# Patient Record
Sex: Male | Born: 1995 | Race: Black or African American | Hispanic: No | Marital: Single | Smoking: Never smoker
Health system: Southern US, Community
[De-identification: ages and names within clinical notes are randomized; demographics above are authoritative.]

## PROBLEM LIST (undated history)

## (undated) HISTORY — PX: ELBOW SURGERY: SHX618

## (undated) HISTORY — PX: OTHER SURGICAL HISTORY: SHX169

---
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
## 2021-07-04 ENCOUNTER — Encounter (HOSPITAL_COMMUNITY): Payer: Self-pay

## 2021-07-04 ENCOUNTER — Ambulatory Visit (HOSPITAL_COMMUNITY)
Admission: EM | Admit: 2021-07-04 | Discharge: 2021-07-04 | Disposition: A | Discharge status: Home / Self Care | Payer: BC Managed Care – PPO | Attending: Nurse Practitioner | Admitting: Nurse Practitioner

## 2021-07-04 ENCOUNTER — Other Ambulatory Visit: Payer: Self-pay

## 2021-07-04 DIAGNOSIS — M546 Pain in thoracic spine: Secondary | ICD-10-CM

## 2021-07-04 DIAGNOSIS — M545 Low back pain, unspecified: Secondary | ICD-10-CM | POA: Diagnosis not present

## 2021-07-04 MED ORDER — CYCLOBENZAPRINE HCL 5 MG PO TABS: 5.0000 mg | ORAL_TABLET | Freq: Three times a day (TID) | ORAL | 0 refills | Status: AC | PRN

## 2021-07-04 NOTE — ED Provider Notes (Signed)
?[facility] ? ? ? ?CSN: [id number] ?Arrival date & time: 07/04/21  1730 ? ? ?  ? ?History   ?Chief Complaint ?Chief Complaint  ?Patient presents with  ? Back Pain  ? ? ?HPI ?Jaime Burton is a 26 y.o. male.  ? ?Patient reports 1 week history of acute back pain.  Reports last week while travelling to [place] for a work Astronomer, he carried heavy luggage on his back while at the airport.  He reports the last couple of days, he has not been able to perform his normal physical activity without having sharp pin-like pain in the middle of his mid back and bilateral low back.  He denies radiation of the pain down his legs and denies numbness or tingling in his toes.  He denies fevers, dysuria or urinary frequency, numbness in between his legs, and new incontinence of bowel or bladder.  He stays active physically working out at [employer] and use to play pro football. He had a back injury a number of years ago that has since resolved.  He has tried taking Tylenol for the pain with moderate relief.  ? ? ? ?History reviewed. No pertinent past medical history. ? ?There are no problems to display for this patient. ? ? ?Past Surgical History:  ?Procedure Laterality Date  ? ELBOW SURGERY    ? shoulder surgery    ? ? ? ? ? ?Home Medications   ? ?Prior to Admission medications   ?Medication Sig Start Date End Date Taking? Authorizing Provider  ?cyclobenzaprine (FLEXERIL) 5 MG tablet Take 1 tablet (5 mg total) by mouth 3 (three) times daily as needed for muscle spasms. Do not take while driving or operating heavy machinery. 07/04/21  Yes Valentino Nose, NP  ? ? ?Family History ?Family History  ?Problem Relation Age of Onset  ? Healthy Mother   ? Healthy Father   ? ? ?Social History ?Social History  ? ?Tobacco Use  ? Smoking status: Never  ? Smokeless tobacco: Never  ?Vaping Use  ? Vaping Use: Never used  ?Substance Use Topics  ? Alcohol use: Yes  ? Drug use: Never  ? ? ? ?Allergies   ?Patient has no known  allergies. ? ? ?Review of Systems ?Review of Systems ?Per HPI ? ?Physical Exam ?Triage Vital Signs ?ED Triage Vitals  ?Enc Vitals Group  ?   BP 07/04/21 1809 119/80  ?   Pulse Rate 07/04/21 1809 78  ?   Resp 07/04/21 1809 16  ?   Temp 07/04/21 1809 98.5 ?F (36.9 ?C)  ?   Temp Source 07/04/21 1809 Oral  ?   SpO2 07/04/21 1809 98 %  ?   Weight --   ?   Height --   ?   Head Circumference --   ?   Peak Flow --   ?   Pain Score 07/04/21 1806 6  ?   Pain Loc --   ?   Pain Edu? --   ?   Excl. in GC? --   ? ?No data found. ? ?Updated Vital Signs ?BP 119/80 (BP Location: Right Arm)   Pulse 78   Temp 98.5 ?F (36.9 ?C) (Oral)   Resp 16   SpO2 98%  ? ?Visual Acuity ?Right Eye Distance:   ?Left Eye Distance:   ?Bilateral Distance:   ? ?Right Eye Near:   ?Left Eye Near:    ?Bilateral Near:    ? ?Physical Exam ?Vitals and nursing note  reviewed.  ?Constitutional:   ?   General: He is not in acute distress. ?   Appearance: Normal appearance. He is not toxic-appearing.  ?Musculoskeletal:  ?   Thoracic back: Bony tenderness present. No swelling, spasms or tenderness.  ?   Lumbar back: Tenderness present. No swelling, spasms or bony tenderness. Normal range of motion. Negative right straight leg raise test and negative left straight leg raise test.  ?     Back: ? ?   Comments: Tender to palpation in the areas marked; no swelling, bony abnormality, deformity, decreased ROM, CVA tenderness  ?Skin: ?   General: Skin is warm and dry.  ?   Capillary Refill: Capillary refill takes less than 2 seconds.  ?   Coloration: Skin is not jaundiced or pale.  ?   Findings: No erythema.  ?Neurological:  ?   Mental Status: He is alert and oriented to person, place, and time.  ?   Motor: No weakness.  ?   Gait: Gait normal.  ?Psychiatric:     ?   Mood and Affect: Mood normal.     ?   Behavior: Behavior normal.     ?   Thought Content: Thought content normal.     ?   Judgment: Judgment normal.  ? ? ? ?UC Treatments / Results  ?Labs ?(all labs ordered  are listed, but only abnormal results are displayed) ?Labs Reviewed - No data to display ? ?EKG ? ? ?Radiology ?No results found. ? ?Procedures ?Procedures (including critical care time) ? ?Medications Ordered in UC ?Medications - No data to display ? ?Initial Impression / Assessment and Plan / UC Course  ?I have reviewed the triage vital signs and the nursing notes. ? ?Pertinent labs & imaging results that were available during my care of the patient were reviewed by me and considered in my medical decision making (see chart for details). ? ?  ?Back pain likely secondary to muscle strain.  There is no history of fall, accident, or known injury.  No red flags in history or on examination.  Treat conservatively with ice/heat, ibuprofen/acetaminophen, and muscle relaxant.  Referral to establish with primary care provider.  If symptoms persist >6 weeks, follow up with PCP for possible imaging or physical therapy.  If symptoms worsen, go to Emergency Room. ?Final Clinical Impressions(s) / UC Diagnoses  ? ?Final diagnoses:  ?Acute midline thoracic back pain  ?Acute bilateral low back pain without sciatica  ? ? ? ?Discharge Instructions   ? ?  ?Please continue light physical activity.  You can use Tylenol or ibuprofen for pain.  At night time, you can take cyclobenzaprine (muscle relaxant).  If your symptoms persist longer than 6 weeks, please seek further care.  If your symptoms worsen, go to ER. ? ? ? ? ?ED Prescriptions   ? ? Medication Sig Dispense Auth. Provider  ? cyclobenzaprine (FLEXERIL) 5 MG tablet Take 1 tablet (5 mg total) by mouth 3 (three) times daily as needed for muscle spasms. Do not take while driving or operating heavy machinery. 30 tablet Valentino Nose, NP  ? ?  ? ?PDMP not reviewed this encounter. ?  ?Valentino Nose, NP ?07/04/21 1834 ? ?

## 2021-07-04 NOTE — ED Triage Notes (Signed)
Pt reports needle like pinches in his back. States it started 3 days ago. States he had a sinus infection a few weeks ago and states he knows has back pain. States he has knots in his back.  ?

## 2021-07-04 NOTE — Discharge Instructions (Addendum)
Please continue light physical activity.  You can use Tylenol or ibuprofen for pain.  At night time, you can take cyclobenzaprine (muscle relaxant).  If your symptoms persist longer than 6 weeks, please seek further care.  If your symptoms worsen, go to ER. ?
